# Patient Record
Sex: Male | Born: 2005 | Hispanic: Yes | Marital: Single | Smoking: Never smoker
Health system: Southern US, Community
[De-identification: ages and names within clinical notes are randomized; demographics above are authoritative.]

---
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
## 2024-03-28 ENCOUNTER — Encounter: Admitting: Nurse Practitioner
